# Patient Record
Sex: Female | Born: 1950 | Race: Black or African American | Hispanic: No | Marital: Married | Smoking: Never smoker
Health system: Southern US, Community
[De-identification: ages and names within clinical notes are randomized; demographics above are authoritative.]

---

## 2004-10-19 ENCOUNTER — Ambulatory Visit: Payer: Self-pay | Admitting: Family Medicine

## 2007-03-30 ENCOUNTER — Ambulatory Visit: Payer: Self-pay | Admitting: Emergency Medicine

## 2007-09-21 ENCOUNTER — Inpatient Hospital Stay: Payer: Self-pay | Admitting: Specialist

## 2007-09-22 ENCOUNTER — Other Ambulatory Visit: Payer: Self-pay

## 2008-03-01 ENCOUNTER — Ambulatory Visit: Payer: Self-pay | Admitting: Internal Medicine

## 2008-03-01 ENCOUNTER — Emergency Department: Payer: Self-pay | Admitting: Unknown Physician Specialty

## 2008-06-16 ENCOUNTER — Ambulatory Visit: Payer: Self-pay | Admitting: Family Medicine

## 2009-01-01 ENCOUNTER — Ambulatory Visit: Payer: Self-pay | Admitting: Internal Medicine

## 2011-04-03 ENCOUNTER — Ambulatory Visit: Payer: Self-pay | Admitting: Internal Medicine

## 2011-06-24 ENCOUNTER — Ambulatory Visit: Payer: Self-pay | Admitting: Family Medicine

## 2015-09-22 DIAGNOSIS — M061 Adult-onset Still's disease: Secondary | ICD-10-CM | POA: Diagnosis not present

## 2015-09-22 DIAGNOSIS — Z7984 Long term (current) use of oral hypoglycemic drugs: Secondary | ICD-10-CM | POA: Diagnosis not present

## 2015-09-22 DIAGNOSIS — R197 Diarrhea, unspecified: Secondary | ICD-10-CM | POA: Diagnosis not present

## 2015-09-22 DIAGNOSIS — E039 Hypothyroidism, unspecified: Secondary | ICD-10-CM | POA: Diagnosis not present

## 2015-09-22 DIAGNOSIS — E119 Type 2 diabetes mellitus without complications: Secondary | ICD-10-CM | POA: Diagnosis not present

## 2015-09-28 DIAGNOSIS — M061 Adult-onset Still's disease: Secondary | ICD-10-CM | POA: Diagnosis not present

## 2015-10-01 DIAGNOSIS — E782 Mixed hyperlipidemia: Secondary | ICD-10-CM | POA: Diagnosis not present

## 2015-10-01 DIAGNOSIS — E1165 Type 2 diabetes mellitus with hyperglycemia: Secondary | ICD-10-CM | POA: Diagnosis not present

## 2015-10-01 DIAGNOSIS — G894 Chronic pain syndrome: Secondary | ICD-10-CM | POA: Diagnosis not present

## 2015-10-01 DIAGNOSIS — G4701 Insomnia due to medical condition: Secondary | ICD-10-CM | POA: Diagnosis not present

## 2015-10-01 DIAGNOSIS — E559 Vitamin D deficiency, unspecified: Secondary | ICD-10-CM | POA: Diagnosis not present

## 2015-10-01 DIAGNOSIS — R5381 Other malaise: Secondary | ICD-10-CM | POA: Diagnosis not present

## 2015-12-14 DIAGNOSIS — E039 Hypothyroidism, unspecified: Secondary | ICD-10-CM | POA: Diagnosis not present

## 2015-12-14 DIAGNOSIS — R5381 Other malaise: Secondary | ICD-10-CM | POA: Diagnosis not present

## 2015-12-14 DIAGNOSIS — R5383 Other fatigue: Secondary | ICD-10-CM | POA: Diagnosis not present

## 2015-12-14 DIAGNOSIS — E230 Hypopituitarism: Secondary | ICD-10-CM | POA: Diagnosis not present

## 2016-01-14 DIAGNOSIS — E119 Type 2 diabetes mellitus without complications: Secondary | ICD-10-CM | POA: Diagnosis not present

## 2016-01-14 DIAGNOSIS — H40003 Preglaucoma, unspecified, bilateral: Secondary | ICD-10-CM | POA: Diagnosis not present

## 2016-01-21 DIAGNOSIS — D123 Benign neoplasm of transverse colon: Secondary | ICD-10-CM | POA: Diagnosis not present

## 2016-01-21 DIAGNOSIS — K5140 Inflammatory polyps of colon without complications: Secondary | ICD-10-CM | POA: Diagnosis not present

## 2016-01-21 DIAGNOSIS — K640 First degree hemorrhoids: Secondary | ICD-10-CM | POA: Diagnosis not present

## 2016-01-21 DIAGNOSIS — Z86010 Personal history of colonic polyps: Secondary | ICD-10-CM | POA: Diagnosis not present

## 2016-01-21 DIAGNOSIS — D125 Benign neoplasm of sigmoid colon: Secondary | ICD-10-CM | POA: Diagnosis not present

## 2016-01-21 DIAGNOSIS — D126 Benign neoplasm of colon, unspecified: Secondary | ICD-10-CM | POA: Diagnosis not present

## 2016-03-07 DIAGNOSIS — Z880 Allergy status to penicillin: Secondary | ICD-10-CM | POA: Diagnosis not present

## 2016-03-07 DIAGNOSIS — Z79899 Other long term (current) drug therapy: Secondary | ICD-10-CM | POA: Diagnosis not present

## 2016-03-07 DIAGNOSIS — I10 Essential (primary) hypertension: Secondary | ICD-10-CM | POA: Diagnosis not present

## 2016-03-07 DIAGNOSIS — E119 Type 2 diabetes mellitus without complications: Secondary | ICD-10-CM | POA: Diagnosis not present

## 2016-03-07 DIAGNOSIS — Z882 Allergy status to sulfonamides status: Secondary | ICD-10-CM | POA: Diagnosis not present

## 2016-03-07 DIAGNOSIS — E039 Hypothyroidism, unspecified: Secondary | ICD-10-CM | POA: Diagnosis not present

## 2016-03-07 DIAGNOSIS — Z7984 Long term (current) use of oral hypoglycemic drugs: Secondary | ICD-10-CM | POA: Diagnosis not present

## 2016-03-07 DIAGNOSIS — M061 Adult-onset Still's disease: Secondary | ICD-10-CM | POA: Diagnosis not present

## 2016-05-26 ENCOUNTER — Ambulatory Visit
Admission: RE | Admit: 2016-05-26 | Discharge: 2016-05-26 | Disposition: A | Discharge status: Home / Self Care | Payer: Medicare Other | Source: Ambulatory Visit | Attending: Family Medicine | Admitting: Family Medicine

## 2016-05-26 ENCOUNTER — Other Ambulatory Visit: Payer: Self-pay | Admitting: Family Medicine

## 2016-05-26 DIAGNOSIS — R51 Headache: Principal | ICD-10-CM

## 2016-05-26 DIAGNOSIS — R519 Headache, unspecified: Secondary | ICD-10-CM

## 2016-05-26 DIAGNOSIS — I672 Cerebral atherosclerosis: Secondary | ICD-10-CM | POA: Insufficient documentation

## 2016-06-22 DIAGNOSIS — I10 Essential (primary) hypertension: Secondary | ICD-10-CM | POA: Diagnosis not present

## 2016-06-22 DIAGNOSIS — E119 Type 2 diabetes mellitus without complications: Secondary | ICD-10-CM | POA: Diagnosis not present

## 2016-06-22 DIAGNOSIS — Z13220 Encounter for screening for lipoid disorders: Secondary | ICD-10-CM | POA: Diagnosis not present

## 2016-07-21 DIAGNOSIS — H2513 Age-related nuclear cataract, bilateral: Secondary | ICD-10-CM | POA: Diagnosis not present

## 2016-07-21 DIAGNOSIS — H40003 Preglaucoma, unspecified, bilateral: Secondary | ICD-10-CM | POA: Diagnosis not present

## 2016-07-21 DIAGNOSIS — E119 Type 2 diabetes mellitus without complications: Secondary | ICD-10-CM | POA: Diagnosis not present

## 2021-09-15 ENCOUNTER — Other Ambulatory Visit: Payer: Self-pay

## 2021-09-15 ENCOUNTER — Other Ambulatory Visit: Payer: Self-pay | Admitting: Family Medicine

## 2021-09-15 ENCOUNTER — Ambulatory Visit
Admission: RE | Admit: 2021-09-15 | Discharge: 2021-09-15 | Disposition: A | Discharge status: Home / Self Care | Payer: Medicare PPO | Attending: Family Medicine | Admitting: Family Medicine

## 2021-09-15 ENCOUNTER — Ambulatory Visit
Admission: RE | Admit: 2021-09-15 | Discharge: 2021-09-15 | Disposition: A | Discharge status: Home / Self Care | Payer: Medicare PPO | Source: Ambulatory Visit | Attending: Family Medicine | Admitting: Family Medicine

## 2021-09-15 DIAGNOSIS — R52 Pain, unspecified: Secondary | ICD-10-CM

## 2022-08-09 ENCOUNTER — Encounter: Payer: Self-pay | Admitting: Emergency Medicine

## 2022-08-09 ENCOUNTER — Ambulatory Visit
Admission: EM | Admit: 2022-08-09 | Discharge: 2022-08-09 | Disposition: A | Discharge status: Home / Self Care | Payer: Medicare PPO

## 2022-08-09 ENCOUNTER — Ambulatory Visit (INDEPENDENT_AMBULATORY_CARE_PROVIDER_SITE_OTHER): Payer: Medicare PPO

## 2022-08-09 DIAGNOSIS — M5442 Lumbago with sciatica, left side: Secondary | ICD-10-CM | POA: Diagnosis not present

## 2022-08-09 MED ORDER — BACLOFEN 10 MG PO TABS: 10.0000 mg | ORAL_TABLET | Freq: Three times a day (TID) | ORAL | 0 refills | Status: DC

## 2022-08-09 MED ORDER — BACLOFEN 10 MG PO TABS: 10.0000 mg | ORAL_TABLET | Freq: Three times a day (TID) | ORAL | 0 refills | Status: AC

## 2022-08-09 NOTE — Discharge Instructions (Addendum)
Your Gerald Stabs today did not show any broken bones in your back or in your left hip.  I believe your pain is coming from soft tissue inflammation as a result of your fall.  Continue to take the Tylenol according to the package instructions as needed for pain.  You can apply lidocaine patches over your low back anterior hip every 8 hours as needed for pain.  Take the baclofen, 10 mg every 8 hours, to help with muscle tension and discomfort.  This will not sedate you.  If your symptoms continue, or they worsen, please return for reevaluation or see your PCP.

## 2022-08-09 NOTE — ED Triage Notes (Signed)
Pt fell at home 4 days ago and landed on her right side. Pt went to the ER that day and X-rays were negative. She felt better yesterday, but today she is started to have left hip,back and abdomen pain.

## 2022-08-09 NOTE — ED Provider Notes (Signed)
[facility]    CSN: [id number] Arrival date & time: 08/09/22  1715      History   Chief Complaint Chief Complaint  Patient presents with   Fall    HPI April Miranda is a 71 y.o. female.   HPI  71 year old female here for evaluation of of left hip pain.  The patient reports that 4 days ago she fell backwards in her study and landed primarily on her right hip and right shoulder.  She was evaluated at [facility] and had radiographs of the right shoulder and right hip which were negative for fracture.  2 days ago she developed pain in her low back, left hip, and left pelvic girdle.  She denies seeing any bruising on the left-hand side.  She states that it hurts to walk and hurts to stand up straight.  History reviewed. No pertinent past medical history.  There are no problems to display for this patient.   History reviewed. No pertinent surgical history.  OB History   No obstetric history on file.      Home Medications    Prior to Admission medications   Medication Sig Start Date End Date Taking? Authorizing Provider  baclofen (LIORESAL) 10 MG tablet Take 1 tablet (10 mg total) by mouth 3 (three) times daily. 08/09/22  Yes Margarette Canada, NP  cetirizine (ZYRTEC) 10 MG tablet Take 1 tablet by mouth daily. 10/06/20  Yes [provider]  Cholecalciferol 125 MCG (5000 UT) TABS Take 1 tablet by mouth daily. 09/07/10  Yes [provider]  latanoprost (XALATAN) 0.005 % ophthalmic solution Administer 1 drop to both eyes nightly. 01/04/22  Yes [provider]  LORazepam (ATIVAN) 2 MG tablet Take by mouth. 08/18/10  Yes [provider]  OZEMPIC, 1 MG/DOSE, 4 MG/3ML SOPN Inject 1 mg into the skin once a week. 08/01/22  Yes [provider]  Probiotic Product (SUPER PROBIOTIC) CAPS daily. 09/07/10  Yes [provider]  hydrochlorothiazide (HYDRODIURIL) 25 MG tablet Take by mouth.    [provider]   losartan (COZAAR) 50 MG tablet Take 50 mg by mouth daily.    [provider]  Magnesium Gluconate 550 MG TABS Take by mouth.    [provider]  pravastatin (PRAVACHOL) 40 MG tablet Take 40 mg by mouth daily.    [provider]    Family History No family history on file.  Social History Social History   Tobacco Use   Smoking status: Never   Smokeless tobacco: Never  Vaping Use   Vaping Use: Never used  Substance Use Topics   Alcohol use: Not Currently     Allergies   Penicillins, Iodine, and Sulfa antibiotics   Review of Systems Review of Systems  Musculoskeletal:  Positive for arthralgias, back pain and myalgias.  Hematological: Negative.   Psychiatric/Behavioral: Negative.       Physical Exam Triage Vital Signs ED Triage Vitals  Enc Vitals Group     BP 08/09/22 1904 123/73     Pulse Rate 08/09/22 1904 91     Resp 08/09/22 1904 16     Temp 08/09/22 1904 98.2 F (36.8 C)     Temp Source 08/09/22 1904 Oral     SpO2 08/09/22 1904 98 %     Weight --      Height --      Head Circumference --      Peak Flow --      Pain Score  08/09/22 1900 9     Pain Loc --      Pain Edu? --      Excl. in [place]? --    No data found.  Updated Vital Signs BP 123/73 (BP Location: Left Arm)   Pulse 91   Temp 98.2 F (36.8 C) (Oral)   Resp 16   SpO2 98%   Visual Acuity Right Eye Distance:   Left Eye Distance:   Bilateral Distance:    Right Eye Near:   Left Eye Near:    Bilateral Near:     Physical Exam Vitals and nursing note reviewed.  Constitutional:      Appearance: Normal appearance. She is not ill-appearing.  Musculoskeletal:        General: Tenderness and signs of injury present. No swelling or deformity.     Comments: Patient has tenderness with palpation of the spinous process of L5.  She also has some mild tenderness with palpation of the left paraspinous region that radiates down into the left buttock and into the left hip.  No  pain with compression of the left greater trochanter.  No rotation or shortening noted.  Skin:    General: Skin is warm and dry.     Capillary Refill: Capillary refill takes less than 2 seconds.  Neurological:     General: No focal deficit present.     Mental Status: She is alert and oriented to person, place, and time.  Psychiatric:        Mood and Affect: Mood normal.        Behavior: Behavior normal.        Thought Content: Thought content normal.        Judgment: Judgment normal.      UC Treatments / Results  Labs (all labs ordered are listed, but only abnormal results are displayed) Labs Reviewed - No data to display  EKG   Radiology DG Hip Unilat W or Wo Pelvis 2-3 Views Left  Result Date: 08/09/2022 CLINICAL DATA:  [place] 4 days ago, hip pain EXAM: DG HIP (WITH OR WITHOUT PELVIS) 2-3V LEFT COMPARISON:  None Available. FINDINGS: Frontal view of the pelvis as well as frontal and frogleg lateral views of the left hip are obtained. No acute fracture, subluxation, or dislocation within either hip. Joint spaces are well preserved. The sacroiliac joints are normal. Soft tissues are unremarkable. IMPRESSION: 1. Unremarkable pelvis and left hip. Electronically Signed   By: Randa Ngo M.D.   On: 08/09/2022 19:50   DG Lumbar Spine 2-3 Views  Result Date: 08/09/2022 CLINICAL DATA:  [place] 4 days ago, back pain EXAM: LUMBAR SPINE - 2-3 VIEW COMPARISON:  None Available. FINDINGS: Frontal and lateral views of the lumbar spine are obtained on 3 images. There are 5 non-rib-bearing lumbar type vertebral bodies identified with minimal left convex curvature centered at L4. Otherwise alignment is anatomic. There are no acute displaced fractures. There is mild diffuse lumbar spondylosis and facet hypertrophy, greatest at T12-L1, L1-L2, and L4-L5. Sacroiliac joints are unremarkable. Visualized portions of the bony pelvis are normal. IMPRESSION: 1. Mild multilevel lumbar spondylosis and facet  hypertrophy. No acute fracture. Electronically Signed   By: Randa Ngo M.D.   On: 08/09/2022 19:49    Procedures Procedures (including critical care time)  Medications Ordered in UC Medications - No data to display  Initial Impression / Assessment and Plan / UC Course  I have reviewed the triage vital signs and the nursing notes.  Pertinent labs &  imaging results that were available during my care of the patient were reviewed by me and considered in my medical decision making (see chart for details).   Patient is a very pleasant, nontoxic-appearing 71 year old female here for evaluation of low back, left hip, and left anterior pelvic girdle pain as outlined HPI above.  Patient is pretty tender to palpation of the spinous process of L5.  She also has some tenderness and mild muscle tension in the lumbar paraspinous region of the left that radiates down to the left buttock and out to the left hip.  She complains of some discomfort but no overt pain with compression of the left greater trochanter.  I suspect that patient has lumbar spasm as a result of her fall which is triggering a sciatic episode.  She indicates that it is difficult to walk and to stand up straight and walk because it increases the pain.  Will obtain radiographs of the lumbar spine and left hip to rule out any bony abnormality.  If no bony abnormality I will discharge patient home and have her continue the Tylenol and lidocaine that was recommended at [facility] and we will also add in baclofen to help with muscle tension.  Radiology report of left AP pelvis and left hip states that there is no evidence of fracture or dislocation.  Radiology impression of lumbar spine series states mild multilevel lumbar spondylosis low cysts and facet hypertrophy but no acute fracture.     Final Clinical Impressions(s) / UC Diagnoses   Final diagnoses:  Acute left-sided low back pain with left-sided sciatica     Discharge Instructions       Your Gerald Stabs today did not show any broken bones in your back or in your left hip.  I believe your pain is coming from soft tissue inflammation as a result of your fall.  Continue to take the Tylenol according to the package instructions as needed for pain.  You can apply lidocaine patches over your low back anterior hip every 8 hours as needed for pain.  Take the baclofen, 10 mg every 8 hours, to help with muscle tension and discomfort.  This will not sedate you.  If your symptoms continue, or they worsen, please return for reevaluation or see your PCP.     ED Prescriptions     Medication Sig Dispense Auth. Provider   baclofen (LIORESAL) 10 MG tablet Take 1 tablet (10 mg total) by mouth 3 (three) times daily. 9 each Margarette Canada, NP      PDMP not reviewed this encounter.   Margarette Canada, NP 08/09/22 1956

## 2023-12-04 IMAGING — CR DG FOREARM 2V*L*
2 series · 2 of 2 positions shown · non-contrast
Comparison: None.

CLINICAL DATA: Left arm pain. Patient reports she had a fall a
couple of weeks ago.

EXAM:
LEFT FOREARM - 2 VIEW

[forearm ap]
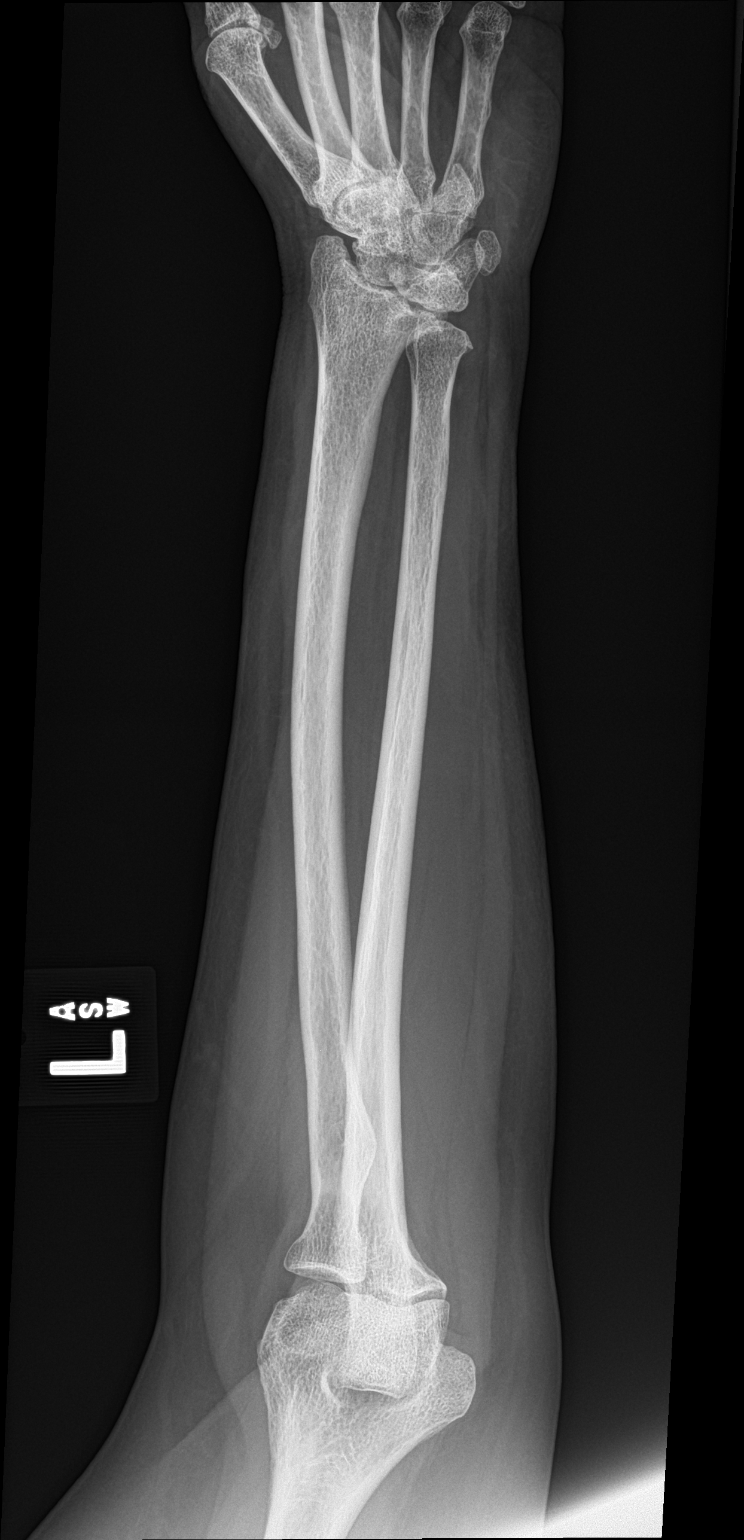

[forearm lat]
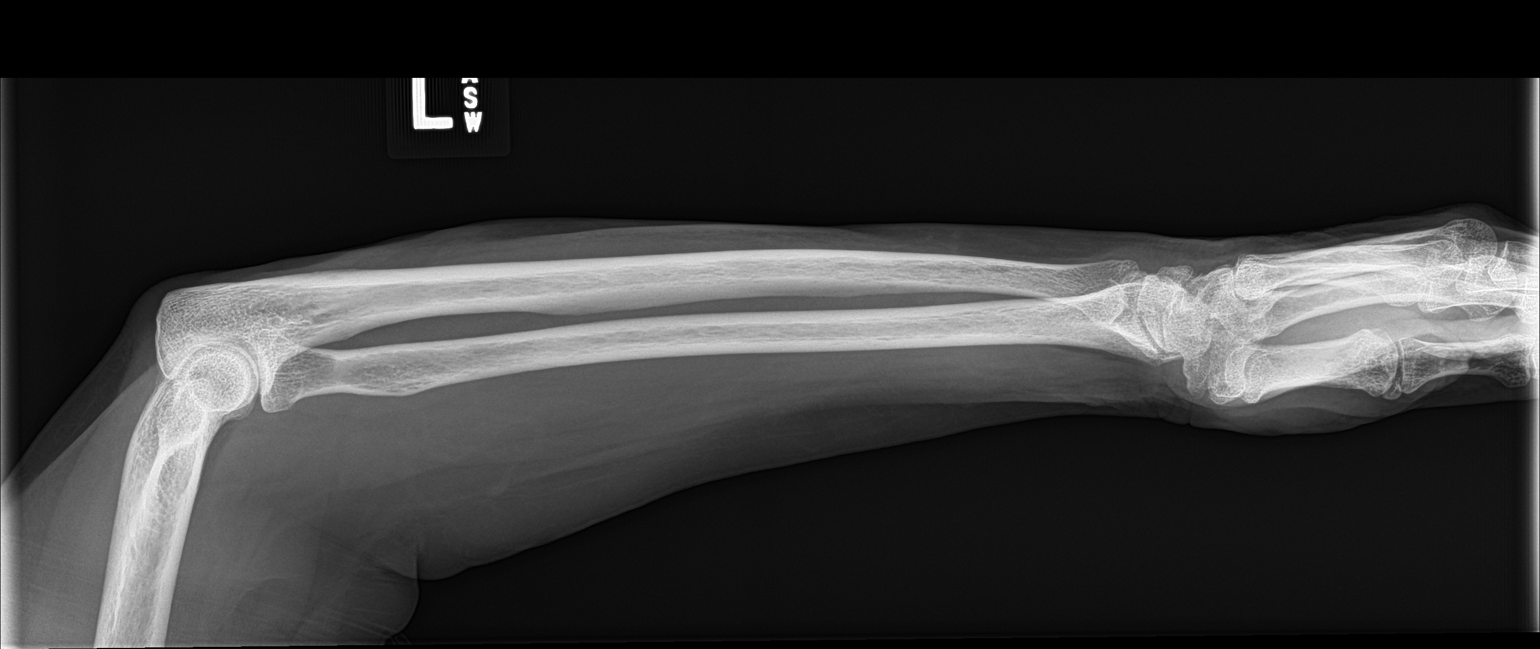

[2 of 2 positions shown; findings below may reference images not displayed]

FINDINGS: There is mildly decreased bone mineralization. Mild medial elbow
peripheral coronoid process and trochlear degenerative spurring.
Moderate to severe radiocarpal joint space narrowing. Moderate thumb
metacarpophalangeal joint space narrowing. No elbow joint effusion.
No acute fracture or dislocation.
IMPRESSION: :
IMPRESSION: 1. Moderate to severe joint space narrowing at the radiocarpal joint
at the wrist.
2. Mild medial elbow osteoarthritis.
